# Patient Record
Sex: Female | Born: 1995 | Race: White | Hispanic: No | Marital: Single | Smoking: Never smoker
Health system: Southern US, Community
[De-identification: ages and names within clinical notes are randomized; demographics above are authoritative.]

---
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
## 2019-05-29 ENCOUNTER — Encounter: Payer: Self-pay | Admitting: Emergency Medicine

## 2019-05-29 ENCOUNTER — Emergency Department
Admission: EM | Admit: 2019-05-29 | Discharge: 2019-05-29 | Disposition: A | Discharge status: Home / Self Care | Payer: BC Managed Care – PPO | Attending: Emergency Medicine | Admitting: Emergency Medicine

## 2019-05-29 ENCOUNTER — Emergency Department: Payer: BC Managed Care – PPO

## 2019-05-29 ENCOUNTER — Other Ambulatory Visit: Payer: Self-pay

## 2019-05-29 DIAGNOSIS — R109 Unspecified abdominal pain: Secondary | ICD-10-CM

## 2019-05-29 DIAGNOSIS — N3001 Acute cystitis with hematuria: Secondary | ICD-10-CM | POA: Diagnosis not present

## 2019-05-29 DIAGNOSIS — N83201 Unspecified ovarian cyst, right side: Secondary | ICD-10-CM | POA: Diagnosis not present

## 2019-05-29 DIAGNOSIS — N3000 Acute cystitis without hematuria: Secondary | ICD-10-CM

## 2019-05-29 DIAGNOSIS — R1031 Right lower quadrant pain: Secondary | ICD-10-CM | POA: Diagnosis present

## 2019-05-29 LAB — URINALYSIS, COMPLETE (UACMP) WITH MICROSCOPIC
Bilirubin Urine: NEGATIVE
Glucose, UA: NEGATIVE mg/dL
Hgb urine dipstick: NEGATIVE
Ketones, ur: 5 mg/dL — AB
Leukocytes,Ua: NEGATIVE
Nitrite: NEGATIVE
Protein, ur: NEGATIVE mg/dL
Specific Gravity, Urine: 1.008 (ref 1.005–1.030)
WBC, UA: NONE SEEN WBC/hpf (ref 0–5)
pH: 7 (ref 5.0–8.0)

## 2019-05-29 LAB — POCT PREGNANCY, URINE: Preg Test, Ur: NEGATIVE

## 2019-05-29 LAB — COMPREHENSIVE METABOLIC PANEL
ALT: 27 U/L (ref 0–44)
AST: 23 U/L (ref 15–41)
Albumin: 4.9 g/dL (ref 3.5–5.0)
Alkaline Phosphatase: 64 U/L (ref 38–126)
Anion gap: 12 (ref 5–15)
BUN: 9 mg/dL (ref 6–20)
CO2: 22 mmol/L (ref 22–32)
Calcium: 9.8 mg/dL (ref 8.9–10.3)
Chloride: 103 mmol/L (ref 98–111)
Creatinine, Ser: 0.59 mg/dL (ref 0.44–1.00)
GFR calc Af Amer: >60 mL/min (ref >60)
GFR calc non Af Amer: >60 mL/min (ref >60)
Glucose, Bld: 88 mg/dL (ref 70–99)
Potassium: 3.8 mmol/L (ref 3.5–5.1)
Sodium: 137 mmol/L (ref 135–145)
Total Bilirubin: 0.8 mg/dL (ref 0.3–1.2)
Total Protein: 7.9 g/dL (ref 6.5–8.1)

## 2019-05-29 LAB — LIPASE, BLOOD: Lipase: 32 U/L (ref 11–51)

## 2019-05-29 LAB — CBC
HCT: 40.4 % (ref 36.0–46.0)
Hemoglobin: 13.5 g/dL (ref 12.0–15.0)
MCH: 29.2 pg (ref 26.0–34.0)
MCHC: 33.4 g/dL (ref 30.0–36.0)
MCV: 87.3 fL (ref 80.0–100.0)
Platelets: 315 10*3/uL (ref 150–400)
RBC: 4.63 MIL/uL (ref 3.87–5.11)
RDW: 11.7 % (ref 11.5–15.5)
WBC: 10.2 10*3/uL (ref 4.0–10.5)
nRBC: 0 % (ref 0.0–0.2)

## 2019-05-29 MED ORDER — CEPHALEXIN 500 MG PO CAPS: 500.0000 mg | ORAL_CAPSULE | Freq: Two times a day (BID) | ORAL | 0 refills | Status: AC

## 2019-05-29 MED ORDER — IBUPROFEN 600 MG PO TABS
600.0000 mg | ORAL_TABLET | Freq: Once | ORAL | Status: AC
  Administered 2019-05-29: 22:00:00 600 mg via ORAL
  Filled 2019-05-29: qty 1

## 2019-05-29 MED ORDER — IOHEXOL 300 MG/ML  SOLN
100.0000 mL | Freq: Once | INTRAMUSCULAR | Status: AC | PRN
  Administered 2019-05-29: 20:00:00 100 mL via INTRAVENOUS

## 2019-05-29 MED ORDER — MORPHINE SULFATE (PF) 2 MG/ML IV SOLN
1.0000 mg | Freq: Once | INTRAVENOUS | Status: AC
  Administered 2019-05-29: 1 mg via INTRAVENOUS
  Filled 2019-05-29: qty 1

## 2019-05-29 MED ORDER — IBUPROFEN 600 MG PO TABS: 600.0000 mg | ORAL_TABLET | Freq: Four times a day (QID) | ORAL | 0 refills | Status: AC | PRN

## 2019-05-29 MED ORDER — CEPHALEXIN 500 MG PO CAPS
500.0000 mg | ORAL_CAPSULE | Freq: Once | ORAL | Status: AC
  Administered 2019-05-29: 500 mg via ORAL
  Filled 2019-05-29: qty 1

## 2019-05-29 MED ORDER — MORPHINE SULFATE (PF) 2 MG/ML IV SOLN
1.0000 mg | Freq: Once | INTRAVENOUS | Status: AC
  Administered 2019-05-29: 17:00:00 1 mg via INTRAVENOUS
  Filled 2019-05-29: qty 1

## 2019-05-29 MED ORDER — ONDANSETRON HCL 4 MG/2ML IJ SOLN
4.0000 mg | Freq: Once | INTRAMUSCULAR | Status: AC
  Administered 2019-05-29: 4 mg via INTRAVENOUS
  Filled 2019-05-29: qty 2

## 2019-05-29 MED ORDER — SODIUM CHLORIDE 0.9% FLUSH: 3.0000 mL | Freq: Once | INTRAVENOUS | Status: DC

## 2019-05-29 MED ORDER — TRAMADOL HCL 50 MG PO TABS: 50.0000 mg | ORAL_TABLET | Freq: Four times a day (QID) | ORAL | 0 refills | Status: AC | PRN

## 2019-05-29 NOTE — ED Notes (Signed)
Patient with complaint of pain. Ed provider notified.

## 2019-05-29 NOTE — ED Triage Notes (Signed)
Pt to ED via POV from [facility]. Pt was sent over for RLQ abd pain. Pt states that the pain started 5 hours ago. Pt states that pain has increased in the last 30 minutes. Pt states that she has had nausea without emesis, denies diarrhea, fevers or chills. Pt is in NAD.

## 2019-05-29 NOTE — ED Provider Notes (Signed)
[facility] Emergency Department Provider Note  ____________________________________________  Time seen: Approximately 5:06 PM  I have reviewed the triage vital signs and the nursing notes.   HISTORY  Chief Complaint Abdominal Pain    HPI Brandi Booth is a 23 y.o. female that presents emergency department for evaluation of nausea and right lower quadrant pain for 5 hours.  Patient has never had abdominal surgery.  No vaginal discharge or concern for STD.  No fever, chills, vomiting, vaginal discharge, dysuria.   History reviewed. No pertinent past medical history.  There are no active problems to display for this patient.   History reviewed. No pertinent surgical history.  Prior to Admission medications   Medication Sig Start Date End Date Taking? Authorizing Provider  cephALEXin (KEFLEX) 500 MG capsule Take 1 capsule (500 mg total) by mouth 2 (two) times daily for 10 days. 05/29/19 06/08/19  [place] DerryWagner, Kerry-Anne Mezo, PA-C  traMADol (ULTRAM) 50 MG tablet Take 1 tablet (50 mg total) by mouth every 6 (six) hours as needed. 05/29/19 05/28/20  [place] DerryWagner, Alannis Hsia, PA-C    Allergies Patient has no known allergies.  No family history on file.  Social History Social History   Tobacco Use  . Smoking status: Never Smoker  . Smokeless tobacco: Never Used  Substance Use Topics  . Alcohol use: Yes  . Drug use: Not Currently     Review of Systems  Constitutional: No fever/chills Cardiovascular: No chest pain. Respiratory: No SOB. Gastrointestinal: Positive for abdominal discomfort.  Positive for nausea.  No vomiting.  Musculoskeletal: Negative for musculoskeletal pain. Skin: Negative for rash, abrasions, lacerations, ecchymosis.   ____________________________________________   PHYSICAL EXAM:  VITAL SIGNS: ED Triage Vitals  Enc Vitals Group     BP 05/29/19 1511 118/78     Pulse Rate 05/29/19 1511 78     Resp 05/29/19 1511 16     Temp 05/29/19 1511 98.4  F (36.9 C)     Temp Source 05/29/19 1511 Oral     SpO2 05/29/19 1511 100 %     Weight 05/29/19 1513 150 lb (68 kg)     Height 05/29/19 1513 5\' 5"  (1.651 m)     Head Circumference --      Peak Flow --      Pain Score 05/29/19 1512 7     Pain Loc --      Pain Edu? --      Excl. in GC? --      Constitutional: Alert and oriented. Well appearing and in no acute distress. Eyes: Conjunctivae are normal. PERRL. EOMI. Head: Atraumatic. ENT:      Ears:      Nose: No congestion/rhinnorhea.      Mouth/Throat: Mucous membranes are moist.  Neck: No stridor.  Cardiovascular: Normal rate, regular rhythm.  Good peripheral circulation. Respiratory: Normal respiratory effort without tachypnea or retractions. Lungs CTAB. Good air entry to the bases with no decreased or absent breath sounds. Gastrointestinal: Bowel sounds 4 quadrants.  Right lower quadrant tenderness to palpation. No guarding or rigidity. No palpable masses. No distention. No CVA tenderness. Musculoskeletal: Full range of motion to all extremities. No gross deformities appreciated. Neurologic:  Normal speech and language. No gross focal neurologic deficits are appreciated.  Skin:  Skin is warm, dry and intact. No rash noted. Psychiatric: Mood and affect are normal. Speech and behavior are normal. Patient exhibits appropriate insight and judgement.   ____________________________________________   LABS (all labs ordered are listed, but only abnormal results are  displayed)  Labs Reviewed  URINALYSIS, COMPLETE (UACMP) WITH MICROSCOPIC - Abnormal; Notable for the following components:      Result Value   Color, Urine YELLOW (*)    APPearance CLEAR (*)    Ketones, ur 5 (*)    Bacteria, UA RARE (*)    All other components within normal limits  LIPASE, BLOOD  COMPREHENSIVE METABOLIC PANEL  CBC  POC URINE PREG, ED  POCT PREGNANCY, URINE    ____________________________________________  EKG   ____________________________________________  RADIOLOGY   Ct Abdomen Pelvis W Contrast  Result Date: 05/29/2019 CLINICAL DATA:  Right lower quadrant pain EXAM: CT ABDOMEN AND PELVIS WITH CONTRAST TECHNIQUE: Multidetector CT imaging of the abdomen and pelvis was performed using the standard protocol following bolus administration of intravenous contrast. CONTRAST:  OMNIPAQUE IOHEXOL 300 MG/ML  SOLN COMPARISON:  None. FINDINGS: Lower chest: Lung bases demonstrate no acute consolidation or effusion. Heart size within normal limits Hepatobiliary: No focal liver abnormality is seen. No gallstones, gallbladder wall thickening, or biliary dilatation. Pancreas: Unremarkable. No pancreatic ductal dilatation or surrounding inflammatory changes. Spleen: Normal in size without focal abnormality. Adrenals/Urinary Tract: Adrenal glands are unremarkable. Kidneys are normal, without renal calculi, focal lesion, or hydronephrosis. Bladder is unremarkable. Stomach/Bowel: Stomach is within normal limits. Appendix appears normal. No evidence of bowel wall thickening, distention, or inflammatory changes. Vascular/Lymphatic: No significant vascular findings are present. No enlarged abdominal or pelvic lymph nodes. Reproductive: Uterus unremarkable.  3.1 cm right adnexal cyst. Other: Negative for free air or free fluid Musculoskeletal: No acute or significant osseous findings. IMPRESSION: 1. Negative for acute appendicitis. 2. 3.1 cm right adnexal cyst. Electronically Signed   By: Jasmine Pang M.D.   On: 05/29/2019 20:00   US Pelvic Complete W Transvaginal And Torsion R/o  Result Date: 05/29/2019 CLINICAL DATA:  Right upper quadrant pain for 1 day EXAM: TRANSABDOMINAL AND TRANSVAGINAL ULTRASOUND OF PELVIS DOPPLER ULTRASOUND OF OVARIES TECHNIQUE: Both transabdominal and transvaginal ultrasound examinations of the pelvis were performed. Transabdominal technique  was performed for global imaging of the pelvis including uterus, ovaries, adnexal regions, and pelvic cul-de-sac. It was necessary to proceed with endovaginal exam following the transabdominal exam to visualize the . Color and duplex Doppler ultrasound was utilized to evaluate blood flow to the ovaries. COMPARISON:  None. FINDINGS: Uterus Measurements: 7.7 x 2.7 x 3.5 = volume: 38 mL. No fibroids or other mass visualized. Endometrium Thickness: 4.8 mm.  No focal abnormality visualized. Right ovary Measurements: 3.8 x 2.1 x 2.7 = volume: 11 mL. Anechoic simple cyst/dominant follicle is seen within the right ovary measuring 2.8 x 2.5 x 1.8 cm. Left ovary Measurements: 2.7 x 0.9 x 1.5 = volume: 2 mL. Normal appearance/no adnexal mass. Pulsed Doppler evaluation of both ovaries demonstrates normal low-resistance arterial and venous waveforms. Other findings No abnormal free fluid. IMPRESSION: Normal pelvic ultrasound. No evidence of ovarian torsion. Right dominant follicle/simple ovarian cyst. Electronically Signed   By: Jonna Clark M.D.   On: 05/29/2019 21:14    ____________________________________________    PROCEDURES  Procedure(s) performed:    Procedures    Medications  sodium chloride flush (NS) 0.9 % injection 3 mL (3 mLs Intravenous Not Given 05/29/19 1854)  cephALEXin (KEFLEX) capsule 500 mg (has no administration in time range)  ibuprofen (ADVIL) tablet 600 mg (has no administration in time range)  ondansetron (ZOFRAN) injection 4 mg (4 mg Intravenous Given 05/29/19 1726)  morphine 2 MG/ML injection 1 mg (1 mg Intravenous Given 05/29/19 1727)  iohexol (OMNIPAQUE) 300 MG/ML solution 100 mL (100 mLs Intravenous Contrast Given 05/29/19 1943)  morphine 2 MG/ML injection 1 mg (1 mg Intravenous Given 05/29/19 2021)     ____________________________________________   INITIAL IMPRESSION / ASSESSMENT AND PLAN / ED COURSE  Pertinent labs & imaging results that were available during my care of  the patient were reviewed by me and considered in my medical decision making (see chart for details).  Review of the [place] CSRS was performed in accordance of the [place] prior to dispensing any controlled drugs.   Patient presents emergency department for evaluation of right lower quadrant pain.  Lab work is reassuring.  CT abdomen and pelvis and pelvic ultrasound are consistent with right ovarian cyst.  Urinalysis shows rare bacteria and will be started on antibiotics for infection.  Urine was sent for culture.  Pain improved with morphine.  Patient will be discharged home with prescriptions for Motrin, tramadol, Keflex. Patient is to follow up with gynecology as directed. Patient is given ED precautions to return to the ED for any worsening or new symptoms.  Brandi Booth was evaluated in Emergency Department on 05/29/2019 for the symptoms described in the history of present illness. She was evaluated in the context of the global COVID-19 pandemic, which necessitated consideration that the patient might be at risk for infection with the SARS-CoV-2 virus that causes COVID-19. Institutional protocols and algorithms that pertain to the evaluation of patients at risk for COVID-19 are in a state of rapid change based on information released by regulatory bodies including the CDC and federal and state organizations. These policies and algorithms were followed during the patient's care in the ED.   ____________________________________________  FINAL CLINICAL IMPRESSION(S) / ED DIAGNOSES  Final diagnoses:  Abdominal pain  Cyst of right ovary  Acute cystitis without hematuria      NEW MEDICATIONS STARTED DURING THIS VISIT:  ED Discharge Orders         Ordered    cephALEXin (KEFLEX) 500 MG capsule  2 times daily     05/29/19 2138    traMADol (ULTRAM) 50 MG tablet  Every 6 hours PRN     05/29/19 2141              This chart was dictated using voice recognition software/Dragon. Despite  best efforts to proofread, errors can occur which can change the meaning. Any change was purely unintentional.    Laban Emperor, PA-C 05/29/19 2158    Carrie Mew, MD 06/02/19 [phone]

## 2019-05-29 NOTE — Discharge Instructions (Signed)
Your ultrasound and your CAT scan shows that you have a cyst on your right ovary.  Your urine shows you may be starting to develop a slight urinary tract infection.  Please take Keflex for infection.  Please follow-up with gynecology or primary care this week.  Return the emergency department for worsening of symptoms.

## 2019-05-29 NOTE — ED Notes (Signed)
Patient transported to CT 

## 2019-05-29 NOTE — ED Notes (Signed)
ED Provider at bedside. 

## 2019-05-29 NOTE — ED Notes (Signed)
Patient transported to Ultrasound 

## 2019-05-29 NOTE — ED Notes (Signed)
Patient returned from CT

## 2020-06-06 IMAGING — US US PELVIS COMPLETE TRANSABD/TRANSVAG W DUPLEX
1 series · 13 of 25 positions shown · non-contrast
Comparison: None.

CLINICAL DATA: Right upper quadrant pain for 1 day

EXAM:
TRANSABDOMINAL AND TRANSVAGINAL ULTRASOUND OF PELVIS
DOPPLER ULTRASOUND OF OVARIES
TECHNIQUE: Both transabdominal and transvaginal ultrasound examinations of the
pelvis were performed. Transabdominal technique was performed for
global imaging of the pelvis including uterus, ovaries, adnexal
regions, and pelvic cul-de-sac.
It was necessary to proceed with endovaginal exam following the
transabdominal exam to visualize the . Color and duplex Doppler
ultrasound was utilized to evaluate blood flow to the ovaries.

[Series 1: us pelvis complete transabd/transvag w duplex · 13 of 39 slices shown]
[im 1/39]
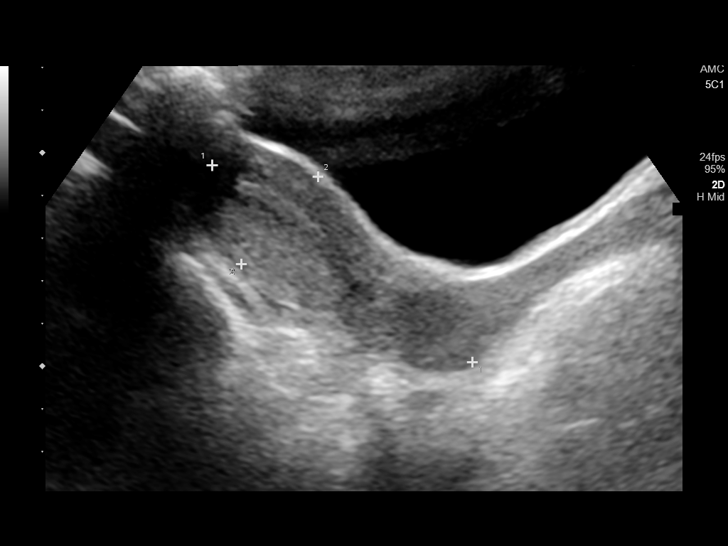
[im 4/39]
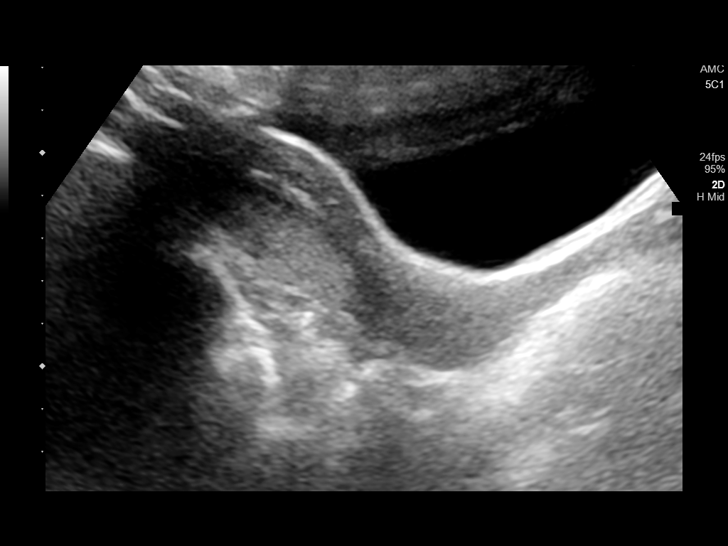
[im 7/39]
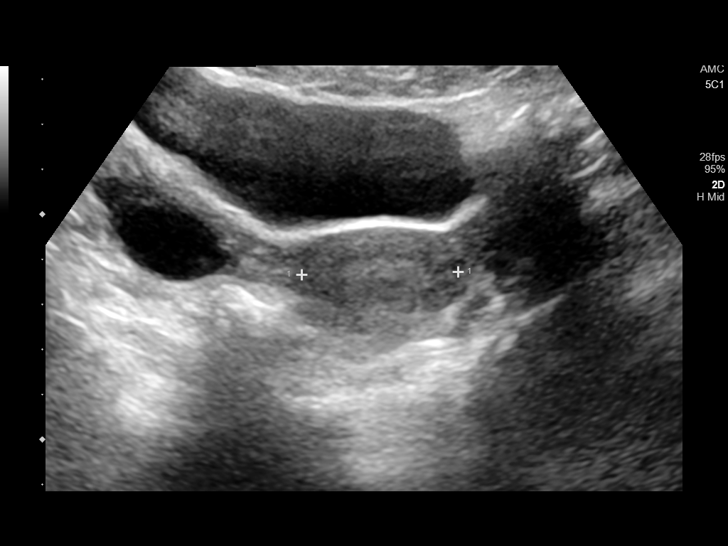
[im 10/39]
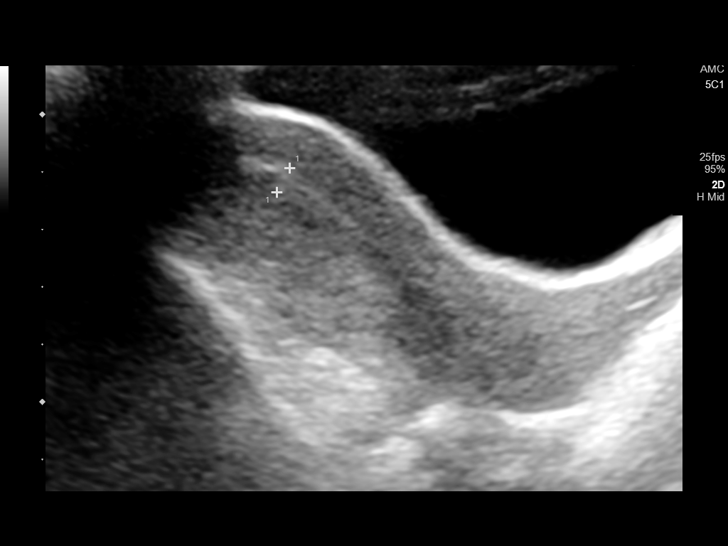
[im 13/39]
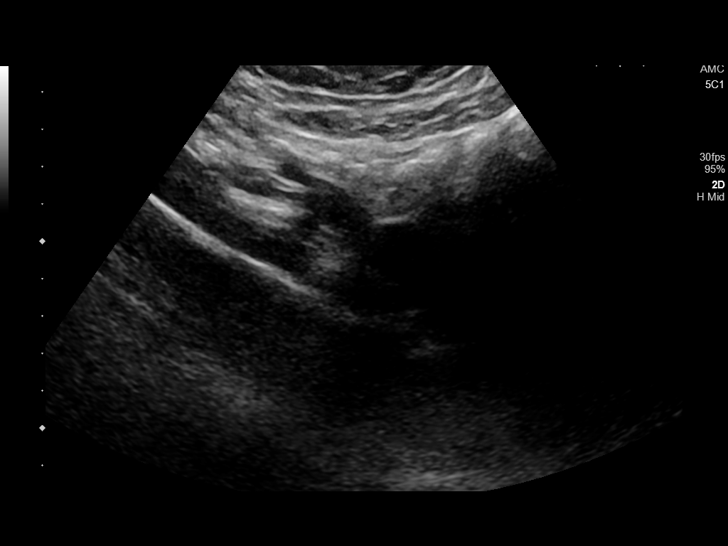
[im 16/39]
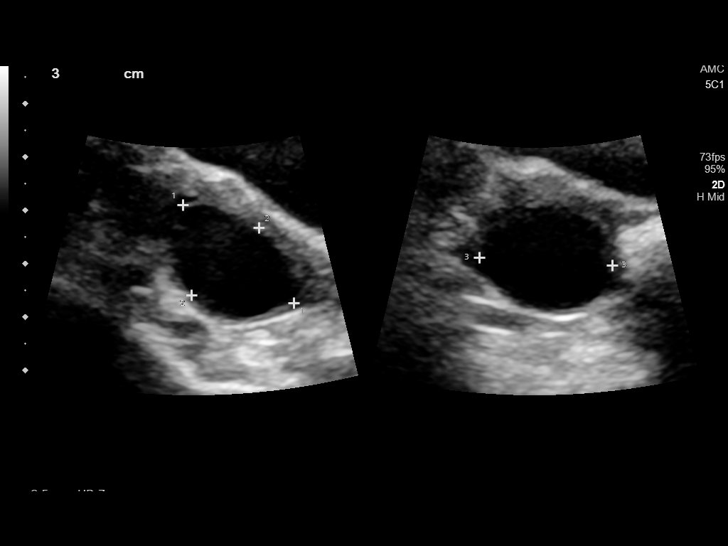
[im 20/39]
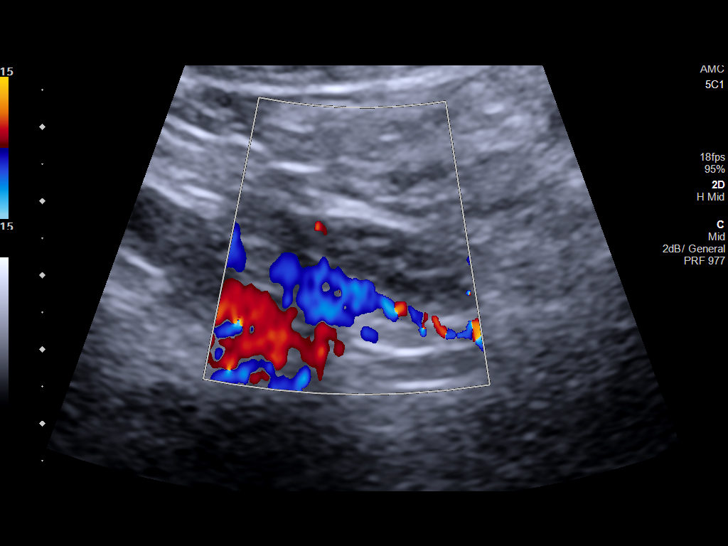
[im 23/39]
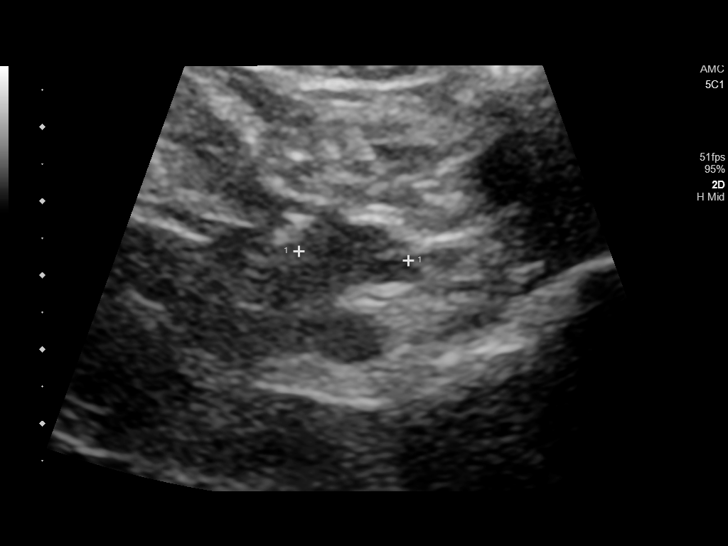
[im 26/39]
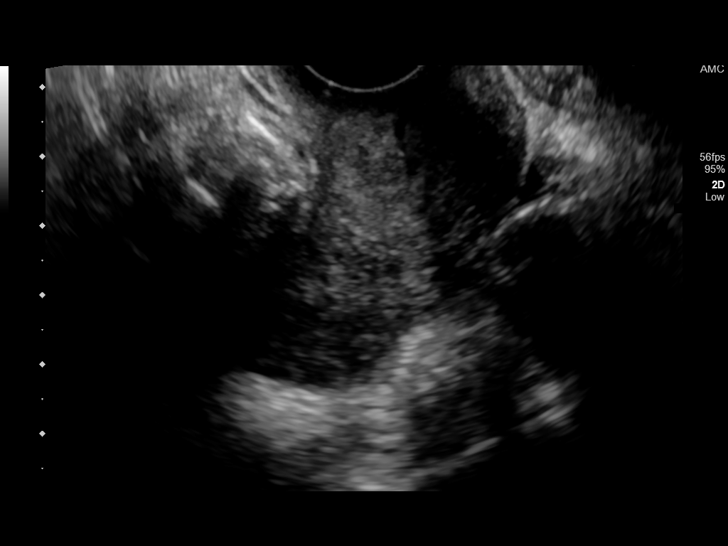
[im 29/39]
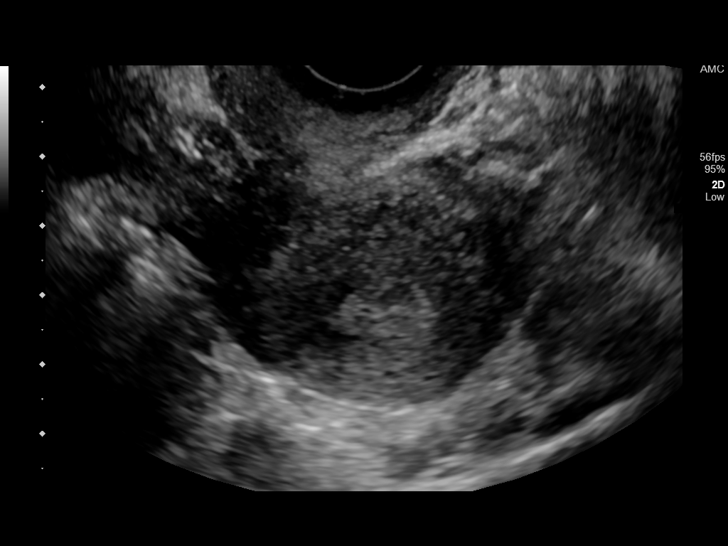
[im 32/39]
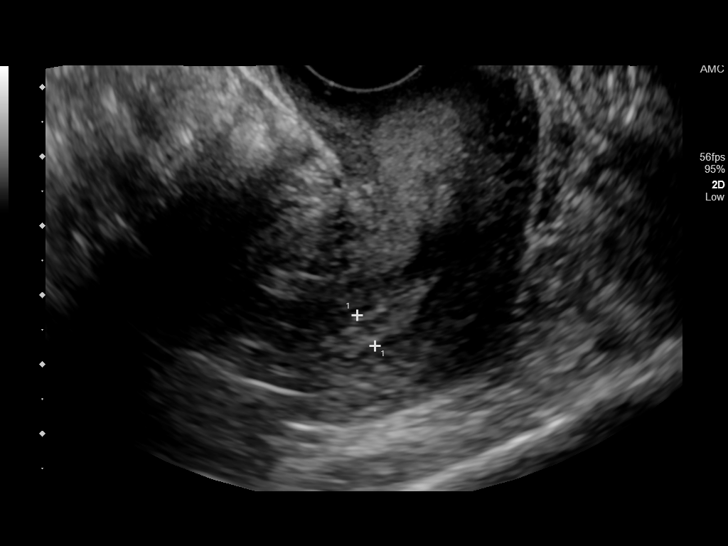
[im 35/39]
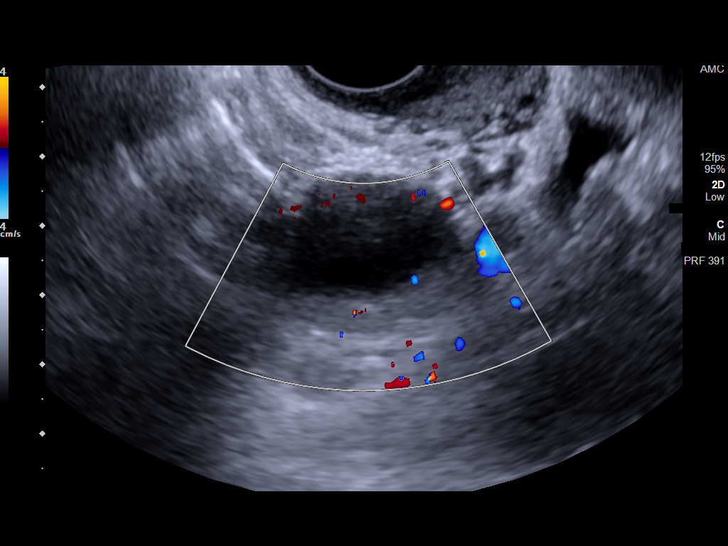
[im 39/39]
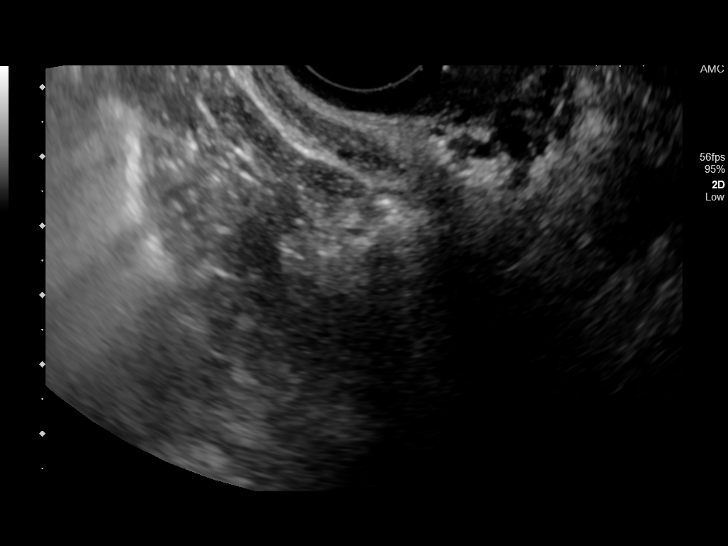

[13 of 25 positions shown; findings below may reference images not displayed]

FINDINGS: Uterus

Measurements: 7.7 x 2.7 x 3.5 = volume: 38 mL. No fibroids or other
mass visualized.

Endometrium

Thickness: 4.8 mm.  No focal abnormality visualized.

Right ovary

Measurements: 3.8 x 2.1 x 2.7 = volume: 11 mL. Anechoic simple
cyst/dominant follicle is seen within the right ovary measuring
x 2.5 x 1.8 cm.

Left ovary

Measurements: 2.7 x 0.9 x 1.5 = volume: 2 mL. Normal appearance/no
adnexal mass.

Pulsed Doppler evaluation of both ovaries demonstrates normal
low-resistance arterial and venous waveforms.

Other findings

No abnormal free fluid.
IMPRESSION: Normal pelvic ultrasound. No evidence of ovarian torsion. Right
dominant follicle/simple ovarian cyst.
